# Patient Record
Sex: Female | Born: 1980 | Race: White | Hispanic: No | Marital: Married | Smoking: Never smoker
Health system: Southern US, Community
[De-identification: ages and names within clinical notes are randomized; demographics above are authoritative.]

## PROBLEM LIST (undated history)

## (undated) DIAGNOSIS — T7840XA Allergy, unspecified, initial encounter: Secondary | ICD-10-CM

## (undated) DIAGNOSIS — F419 Anxiety disorder, unspecified: Secondary | ICD-10-CM

## (undated) HISTORY — DX: Anxiety disorder, unspecified: F41.9

## (undated) HISTORY — DX: Allergy, unspecified, initial encounter: T78.40XA

---

## 2000-03-19 ENCOUNTER — Emergency Department (HOSPITAL_COMMUNITY): Admission: EM | Admit: 2000-03-19 | Discharge: 2000-03-20 | Payer: Self-pay | Admitting: Emergency Medicine

## 2001-03-01 ENCOUNTER — Other Ambulatory Visit: Admission: RE | Admit: 2001-03-01 | Discharge: 2001-03-01 | Payer: Self-pay | Admitting: Obstetrics and Gynecology

## 2001-03-01 ENCOUNTER — Encounter (INDEPENDENT_AMBULATORY_CARE_PROVIDER_SITE_OTHER): Payer: Self-pay | Admitting: Specialist

## 2001-03-14 ENCOUNTER — Other Ambulatory Visit: Admission: RE | Admit: 2001-03-14 | Discharge: 2001-03-14 | Payer: Self-pay | Admitting: Obstetrics and Gynecology

## 2001-03-14 ENCOUNTER — Encounter (INDEPENDENT_AMBULATORY_CARE_PROVIDER_SITE_OTHER): Payer: Self-pay

## 2001-06-17 ENCOUNTER — Other Ambulatory Visit: Admission: RE | Admit: 2001-06-17 | Discharge: 2001-06-17 | Payer: Self-pay | Admitting: *Deleted

## 2001-09-16 ENCOUNTER — Other Ambulatory Visit: Admission: RE | Admit: 2001-09-16 | Discharge: 2001-09-16 | Payer: Self-pay | Admitting: Obstetrics and Gynecology

## 2001-12-24 ENCOUNTER — Other Ambulatory Visit: Admission: RE | Admit: 2001-12-24 | Discharge: 2001-12-24 | Payer: Self-pay | Admitting: Obstetrics and Gynecology

## 2002-03-21 ENCOUNTER — Other Ambulatory Visit: Admission: RE | Admit: 2002-03-21 | Discharge: 2002-03-21 | Payer: Self-pay | Admitting: Obstetrics and Gynecology

## 2003-01-09 ENCOUNTER — Other Ambulatory Visit: Admission: RE | Admit: 2003-01-09 | Discharge: 2003-01-09 | Payer: Self-pay | Admitting: Obstetrics and Gynecology

## 2003-08-27 ENCOUNTER — Inpatient Hospital Stay (HOSPITAL_COMMUNITY): Admission: AD | Admit: 2003-08-27 | Discharge: 2003-08-27 | Payer: Self-pay | Admitting: Obstetrics and Gynecology

## 2003-08-28 ENCOUNTER — Ambulatory Visit (HOSPITAL_COMMUNITY): Admission: RE | Admit: 2003-08-28 | Discharge: 2003-08-28 | Payer: Self-pay | Admitting: Obstetrics and Gynecology

## 2003-10-29 ENCOUNTER — Inpatient Hospital Stay (HOSPITAL_COMMUNITY): Admission: AD | Admit: 2003-10-29 | Discharge: 2003-10-29 | Payer: Self-pay | Admitting: Obstetrics and Gynecology

## 2003-10-30 ENCOUNTER — Inpatient Hospital Stay (HOSPITAL_COMMUNITY): Admission: AD | Admit: 2003-10-30 | Discharge: 2003-11-02 | Payer: Self-pay | Admitting: Obstetrics and Gynecology

## 2004-02-18 ENCOUNTER — Other Ambulatory Visit: Admission: RE | Admit: 2004-02-18 | Discharge: 2004-02-18 | Payer: Self-pay | Admitting: Obstetrics and Gynecology

## 2004-03-07 ENCOUNTER — Encounter: Admission: RE | Admit: 2004-03-07 | Discharge: 2004-03-07 | Payer: Self-pay | Admitting: Family Medicine

## 2005-08-09 ENCOUNTER — Inpatient Hospital Stay (HOSPITAL_COMMUNITY): Admission: AD | Admit: 2005-08-09 | Discharge: 2005-08-09 | Payer: Self-pay | Admitting: Obstetrics and Gynecology

## 2005-08-10 ENCOUNTER — Inpatient Hospital Stay (HOSPITAL_COMMUNITY): Admission: AD | Admit: 2005-08-10 | Discharge: 2005-08-13 | Payer: Self-pay | Admitting: Obstetrics and Gynecology

## 2006-01-16 ENCOUNTER — Other Ambulatory Visit: Admission: RE | Admit: 2006-01-16 | Discharge: 2006-01-16 | Payer: Self-pay | Admitting: Obstetrics and Gynecology

## 2007-03-28 ENCOUNTER — Inpatient Hospital Stay (HOSPITAL_COMMUNITY): Admission: AD | Admit: 2007-03-28 | Discharge: 2007-03-28 | Payer: Self-pay | Admitting: Obstetrics and Gynecology

## 2007-06-01 ENCOUNTER — Inpatient Hospital Stay (HOSPITAL_COMMUNITY): Admission: AD | Admit: 2007-06-01 | Discharge: 2007-06-03 | Payer: Self-pay | Admitting: Obstetrics and Gynecology

## 2007-06-15 ENCOUNTER — Inpatient Hospital Stay (HOSPITAL_COMMUNITY): Admission: AD | Admit: 2007-06-15 | Discharge: 2007-06-15 | Payer: Self-pay | Admitting: Obstetrics and Gynecology

## 2008-09-26 ENCOUNTER — Inpatient Hospital Stay (HOSPITAL_COMMUNITY): Admission: AD | Admit: 2008-09-26 | Discharge: 2008-09-26 | Payer: Self-pay | Admitting: Obstetrics and Gynecology

## 2009-04-02 ENCOUNTER — Inpatient Hospital Stay (HOSPITAL_COMMUNITY): Admission: AD | Admit: 2009-04-02 | Discharge: 2009-04-05 | Payer: Self-pay | Admitting: Obstetrics and Gynecology

## 2011-01-01 LAB — CBC
HCT: 33.4 % — ABNORMAL LOW (ref 36.0–46.0)
HCT: 36.3 % (ref 36.0–46.0)
Hemoglobin: 11.8 g/dL — ABNORMAL LOW (ref 12.0–15.0)
Hemoglobin: 12.7 g/dL (ref 12.0–15.0)
MCHC: 35 g/dL (ref 30.0–36.0)
MCHC: 35.2 g/dL (ref 30.0–36.0)
MCV: 94.4 fL (ref 78.0–100.0)
MCV: 95 fL (ref 78.0–100.0)
Platelets: 116 10*3/uL — ABNORMAL LOW (ref 150–400)
Platelets: 135 10*3/uL — ABNORMAL LOW (ref 150–400)
RBC: 3.52 MIL/uL — ABNORMAL LOW (ref 3.87–5.11)
RBC: 3.84 MIL/uL — ABNORMAL LOW (ref 3.87–5.11)
RDW: 13.1 % (ref 11.5–15.5)
RDW: 13.1 % (ref 11.5–15.5)
WBC: 11 10*3/uL — ABNORMAL HIGH (ref 4.0–10.5)
WBC: 9.2 10*3/uL (ref 4.0–10.5)

## 2011-01-01 LAB — RPR: RPR Ser Ql: NONREACTIVE

## 2011-01-01 LAB — CCBB MATERNAL DONOR DRAW

## 2011-01-09 LAB — RAPID STREP SCREEN (MED CTR MEBANE ONLY): Streptococcus, Group A Screen (Direct): NEGATIVE

## 2011-02-07 NOTE — H&P (Signed)
NAME:  Jeanne Mercado, Jeanne Mercado NO.:  [id number]   MEDICAL RECORD NO.:  [id number]          PATIENT TYPE:  INP   LOCATION:  9111                          FACILITY:  [facility]   PHYSICIAN:  Osborn Coho, M.D.   DATE OF BIRTH:  12-31-1980   DATE OF ADMISSION:  06/01/2007  DATE OF DISCHARGE:                              HISTORY & PHYSICAL   Jeanne Mercado is a 30 year old married white female, gravida 3, para 2-0-  0-2 at 39-1/7 weeks, who presents with contractions this morning.  She  denies leaking or bleeding.  Reports positive fetal movement.  Her  pregnancy has been followed by the [facility] nurse midwife  service and has been remarkable for:  1. LEEP procedure in 2002.  2. History of mild asthma.  3. Group B strep negative.   Her prenatal labs were collected on November 08, 2006.  Hemoglobin was  13.5, hematocrit 41.1, platelets 194,000.  Blood type A+.  Antibody  negative.  RPR nonreactive.  Rubella immune.  Hepatitis B surface  antigen negative.  HIV nonreactive.  Pap smear within normal limits.  A  one-hour Glucola from March 06, 2007 was 62.  RPR at that time was  nonreactive.  Culture of the vaginal tract for Group B strep on May 06, 2007 was negative.   HISTORY OF PRESENT PREGNANCY:  Patient presented for care at [facility] on November 08, 2006 at [redacted] weeks gestation.  She declined first-  trimester screening.  Ultrasonography on this date was done due to  inability to hear fetal heart tones.  EDC was confirmed to be June 07, 2007, which was consistent with last menstrual period dating.  Anatomy ultrasound was done at 18-1/[redacted] weeks gestation.  There was low-  lying placenta at that time.  Patient declined quad screen.  Ultrasound  was performed at 26-1/[redacted] weeks gestation to evaluate placenta, which was  no longer low-lying.  Estimated fetal weight was at the 86 percentile  with normal fluid.  The rest of her prenatal care was  unremarkable.   OB HISTORY:  She is gravida 3, para 2-0-0-2.  In February, 2005, she had  a vaginal delivery of a female infant weighing 6 pounds, 5 ounces at [redacted]  weeks gestation after 6 hours of later.  His name is Fayrene Fearing.  The  pregnancy was uncomplicated.  In November, 2006, she had a vaginal  delivery of a female infant weighing 6 pounds, 12 ounces at [redacted] weeks  gestation after nine hours in labor.  She had an epidural for  anesthesia.  His name is Chrissie Noa.  That pregnancy and birth are  uncomplicated as well.  This current pregnancy is the third pregnancy.   PAST MEDICAL HISTORY:  No medication allergies.  She experienced  menarche at the age of 59 with 28-30 day cycles lasting three days.  She  has used oral contraceptives and condoms in the past for contraception.  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  She reports having had the usual  childhood illnesses.  She  was diagnosed in 2004 with asthma but does not require medications.  She  has reflux with a possible ulcer in the past.  She fractured her left  arm as a child.   Surgical history is remarkable for tubes being placed in her ear as a  child.  She also has sporadic hypoglycemia.   FAMILY HISTORY:  Paternal grandmother with chronic hypertension.  Paternal grandmother with varicosities.  Maternal aunt and mother with  hypothyroidism.  Father has prostate cancer.  Father has a history of  abuse by his parents.   GENETIC HISTORY:  Remarkable for mother with malformed ear and unable to  hear out of that ear.  Also, mother with heart murmur.   SOCIAL HISTORY:  Patient is married to the father of the baby.  His name  is Trey Paula.  He is involved and supportive.  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  Father of the baby has six years of college  education and is employed full time.  They deny any alcohol, tobacco, or  illicit drug use with the pregnancy.   OBJECTIVE DATA:  VITAL  SIGNS:  Stable.  She is afebrile.  HEENT:  Grossly within normal limits.  CHEST:  Clear to auscultation.  HEART:  Regular rate and rhythm.  ABDOMEN: Gravid in contour.  Fundal height extending approximately 37 cm  above the pubic symphysis.  Fetal heart rate is in the 140s and  reassuring with occasional variable.  The cervix is complete, 100%  effaced, vertex at 0 station with intact membranes.  EXTREMITIES:  Normal.   ASSESSMENT:  1. Intrauterine pregnancy at term.  2. Transition and end of first stage.   PLAN:  1. Prepare for birth.  2. Will stay in maternity admissions secondary to room shortage on      labor and delivery.      Cam Hai, C.N.M.      Osborn Coho, M.D.  Electronically Signed    KS/MEDQ  D:  06/01/2007  T:  06/01/2007  Job:  [id number]

## 2011-02-10 NOTE — H&P (Signed)
NAME:  Jeanne Mercado, Jeanne Mercado               ACCOUNT NO.:  [id number]   MEDICAL RECORD NO.:  [id number]          PATIENT TYPE:  INP   LOCATION:  9165                          [facility]ACILITY:  [facility]   PHYSICIAN:  Naima A. Dillard, M.D. DATE OF BIRTH:  [birth year]   DATE OF ADMISSION:  08/10/2005  DATE OF DISCHARGE:                                HISTORY & PHYSICAL   HISTORY OF PRESENT ILLNESS:  The patient is a 30 year old, gravida 2, para 1-  0-0-1, admitted at [redacted] weeks gestation with complaints of regular uterine  contractions since 6 p.m. on the day of admission.  The patient has been  having prodromal labor since August 09, 2005 but the patient reports that  the uterine contractions increased in frequency and intensity as noted  above.  The patient denies any leakage of fluid or bleeding.  The patient  reports that her fetus is moving normally.  The patient reports that she has  had some bloody mucus at times.  The patient's group B strep test is  negative.  The patient is seen in MAU on August 09, 2005 at which time her  cervix was 3-4 cm and did not change despite ambulation.  The patient's  pregnancy is remarkable:  1.  History of LEEP procedure.  2.  Mild asthma.  3.  Transferred in at 28 weeks.   PRENATAL LABS:  Hemoglobin 12.4, hematocrit 36.2, blood type A positive,  antibody screen negative.  Sickle cell trait negative. I do not know why  that was done.  RPR nonreactive.  Rubella titer immune.  Hepatitis C  nonreactive.  HIV was declined.  Pap smear within normal limits.  Gonorrhea  and Chlamydia both negative.  Quad screen negative.  One-hour Glucola 114.  Twenty-eight week hemoglobin 12.1, group B strep negative.  Repeat GC and  Chlamydia cultures in the third trimester were declined.   HISTORY OF PRESENT PREGNANCY:  The patient entered care at [facility] after transfer at [redacted] weeks gestation.  The patient had been receiving  routine prenatal care prior to 28 weeks since  approximately 10 weeks.  The  patient recently moved back to the area and, therefore, desired to  reestablish care at [facility].  The patient's last menstrual  period November 16, 2004 which gives an estimated date of confinement of  August 24, 2005.  That estimated date of confinement was confirmed by  ultrasound.  The patient's prenatal course has been very unremarkable.  The  patient's ultrasound previously done prior to transfer and was reviewed and  was within normal limits. Otherwise the patient's pregnancy unremarkable.   OB HISTORY:  1.  February 2005, spontaneous vaginal delivery.  Female infant, 6 pounds 5      ounces at [redacted] weeks gestation, six-hour labor without complications.  2.  Present.   GYN HISTORY:  The patient reports menarche at age 18 with 28-30 day cycles,  lasting three days.  The patient denies any history of STDs.  The patient  has a history of abnormal Pap smears and treated with LEEP.  However, the  patient's repeat Pap smears have been within normal limits since her LEEP  procedure.   PAST MEDICAL HISTORY:  1.  Mild asthma.  2.  Occasional GI reflux.  3.  Non-recurrent UTIs.  4.  History of pyelonephritis x1.  5.  The patient also with history of hypoglycemia at times.   PAST SURGICAL HISTORY:  Bilateral myringotomy as an infant. Otherwise  negative.   ALLERGIES:  SEAFOOD and IODINE.   CURRENT MEDICATIONS:  Prenatal vitamins.   FAMILY HISTORY:  Maternal grandmother with hypertension and varicose veins.  Father prostate cancer.  Otherwise negative.   GENETIC HISTORY:  Mother with malformed ear with hearing loss as well as a  heart murmur.  Otherwise negative genetic however.   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  The patient is  married to [place] who is the father of the baby who is involved and  supportive.  The patient denies use of tobacco, alcohol or street drugs.  The patient's domestic violence screen is  negative.   REVIEW OF SYSTEMS:  Typical of the last trimester of pregnancy.   PHYSICAL EXAMINATION:  VITAL SIGNS:  The patient is afebrile.  Vital signs  are stable.  HEENT:  Within normal limits.  LUNGS:  Clear.  HEART:  Regular rate and rhythm without murmur, rub, or gallop.  BREASTS:  Soft.  ABDOMEN:  Soft, nontender and gravid with fundal height consistent with [redacted]  weeks gestation.  Fetus is longitudinal lie and vertex by Leopold's  maneuvers. Fetal heart rate 140's with accelerations noted to be present and  short-term variabilities present.  Fetal heart rate is overall reassuring.  Uterine contractions are noted to be every two to four minutes and moderate  to strong to palpation.  PELVIC:  On sterile vaginal exam, cervix 7 cm dilated, completely effaced, -  2 station.  Vertex presentation and bulging bag of waters.  EXTREMITIES:  No edema.  Homan's sign negative bilaterally.   ASSESSMENT:  1.  Intrauterine pregnancy at 38 weeks.  2.  Labor.   PLAN:  1.  The patient will be admitted to birthing suite for consult with Dr.      Jaymes Graff.  2.  Routine CNM orders.  3.  The patient plans an unmedicated birth if at all possible.      Rhona Leavens, CNM      Naima A. Normand Sloop, M.D.  Electronically Signed    NOS/MEDQ  D:  08/11/2005  T:  08/11/2005  Job:  [id number]

## 2011-02-10 NOTE — H&P (Signed)
NAME:  ALEXSYS, ESKIN NO.:  [id number]   MEDICAL RECORD NO.:  [id number]                   PATIENT TYPE:  INP   LOCATION:  9165                                 FACILITY:  [facility]   PHYSICIAN:  Osborn Coho, M.D.                DATE OF BIRTH:  1981-01-27   DATE OF ADMISSION:  10/30/2003  DATE OF DISCHARGE:                                HISTORY & PHYSICAL   Ms. Mcloughlin is a 30 year old married white female, gravida 1, para 0 at 74  1/[redacted] weeks gestation who presents complaining of uterine contractions every 4-  5 minutes all day long and now with increased intensity.  She denies any  leaking but has seen some bloody show. She reports positive fetal movement.  Her pregnancy has been followed at [facility] by the Certified  Nurse Midwife service and has been essentially uncomplicated though at risk  for 1) history of a LEEP procedure in 2002, 2) first trimester bleeding and  3) history of asthma.  Her group B strep is negative.  She desires to have  as noninterventive a labor as possible.   OB/GYN HISTORY:  She is a primigravida with LMP of Feb 11, 2003 giving an  [facility] of November 18, 2003 confirmed by early ultrasound.  Her other GYN  history:  She has used oral contraceptives in the past.  She had an abnormal  Pap in 2002 that was treated with colposcopy and LEEP procedure and her  PAP's have been normal subsequently.   GENERAL MEDICAL HISTORY:  She is allergic to SEAFOOD and seasonal allergies.  She reports having had the usual childhood diseases.  She reports a history  of asthma but no problems with this pregnancy. She reports a history of  heartburn in the past, occasional urinary tract infections and fractured  left arm in fifth grade and her only surgery was having tubes placed in her  ears when she was an infant.   FAMILY HISTORY:  Significant for paternal grandmother with hypertension,  paternal aunt with aneurysm, paternal  grandmother with varicose veins, great  maternal grandmother with diabetes.  Aunt with hypothyroidism, maternal  grandfather with Parkinson's disease.   GENETIC HISTORY:  Negative.   SOCIAL HISTORY:  She is married to Festus Holts who is involved and  supportive. She is employed as a Runner, broadcasting/film/video, he is employed as a Research scientist (medical).  They are of the [employer]. They deny any illicit drug use, alcohol or  smoking with this pregnancy.   PRENATAL LABS:  Her blood type is A positive, antibody screen is negative,  syphilis is nonreactive, rubella is immune, hepatitis B surface antigen is  negative, HIV is nonreactive.  GC and chlamydia are both negative.  Pap is  within normal limits and her 36 week beta strep was negative.   PHYSICAL EXAMINATION:  VITAL SIGNS:  Stable, she is afebrile.  HEENT:  Grossly within normal limits.  HEART:  Regular rhythm and rate.  CHEST:  Clear.  BREASTS:  Soft and nontender.  ABDOMEN:  Gravid with uterine contractions every four minutes. Fetal heart  rate is reactive and reassuring.  Her cervix is 5 1/2 cm, 100% vertex, -1  with bulging membranes.  EXTREMITIES:  Within normal limits.   ASSESSMENT:  1. Intrauterine pregnancy at term.  2. Active labor.  3. Negative group B strep.   PLAN:  Admit to labor and delivery, to follow routine midwife orders and Dr.  Su Hilt has been notified of the patient's admission.     Concha Pyo. Duplantis, C.N.M.              Osborn Coho, M.D.    SJD/MEDQ  D:  10/30/2003  T:  10/30/2003  Job:  [id number]

## 2011-07-06 LAB — URINE MICROSCOPIC-ADD ON

## 2011-07-06 LAB — DIFFERENTIAL
Basophils Absolute: 0
Basophils Relative: 0
Eosinophils Absolute: 0.2
Eosinophils Relative: 2
Lymphocytes Relative: 12
Lymphs Abs: 1.1
Monocytes Absolute: 0.4
Monocytes Relative: 4
Neutro Abs: 7.6
Neutrophils Relative %: 82 — ABNORMAL HIGH

## 2011-07-06 LAB — CBC
HCT: 38.4
Hemoglobin: 13.4
MCHC: 34.8
MCV: 91.5
Platelets: 191
RBC: 4.2
RDW: 12.2
WBC: 9.3

## 2011-07-06 LAB — URINALYSIS, ROUTINE W REFLEX MICROSCOPIC
Bilirubin Urine: NEGATIVE
Glucose, UA: NEGATIVE
Ketones, ur: NEGATIVE
Leukocytes, UA: NEGATIVE
Nitrite: NEGATIVE
Protein, ur: NEGATIVE
Specific Gravity, Urine: <1.005 — ABNORMAL LOW
Urobilinogen, UA: 0.2
pH: 5.5

## 2011-07-07 LAB — CBC
HCT: 30.3 — ABNORMAL LOW
HCT: 35 — ABNORMAL LOW
Hemoglobin: 10.7 — ABNORMAL LOW
Hemoglobin: 12.3
MCHC: 35.2
MCHC: 35.3
MCV: 92.6
MCV: 93.6
Platelets: 132 — ABNORMAL LOW
Platelets: 160
RBC: 3.24 — ABNORMAL LOW
RBC: 3.78 — ABNORMAL LOW
RDW: 12.5
RDW: 13.2
WBC: 13 — ABNORMAL HIGH
WBC: 18 — ABNORMAL HIGH

## 2011-07-07 LAB — RPR: RPR Ser Ql: NONREACTIVE

## 2011-07-11 LAB — URINALYSIS, ROUTINE W REFLEX MICROSCOPIC
Bilirubin Urine: NEGATIVE
Glucose, UA: NEGATIVE
Hgb urine dipstick: NEGATIVE
Ketones, ur: NEGATIVE
Nitrite: NEGATIVE
Protein, ur: NEGATIVE
Specific Gravity, Urine: 1.01
Urobilinogen, UA: 0.2
pH: 6

## 2011-07-11 LAB — WET PREP, GENITAL
Clue Cells Wet Prep HPF POC: NONE SEEN
Trich, Wet Prep: NONE SEEN
Yeast Wet Prep HPF POC: NONE SEEN

## 2011-07-11 LAB — FETAL FIBRONECTIN: Fetal Fibronectin: NEGATIVE

## 2013-01-29 ENCOUNTER — Other Ambulatory Visit: Payer: Self-pay | Admitting: Certified Nurse Midwife

## 2013-02-01 ENCOUNTER — Other Ambulatory Visit: Payer: Self-pay

## 2013-02-05 ENCOUNTER — Ambulatory Visit
Admission: RE | Admit: 2013-02-05 | Discharge: 2013-02-05 | Disposition: A | Discharge status: Home / Self Care | Payer: BC Managed Care – PPO | Source: Ambulatory Visit | Attending: Certified Nurse Midwife | Admitting: Certified Nurse Midwife

## 2013-02-05 MED ORDER — GADOBENATE DIMEGLUMINE 529 MG/ML IV SOLN
5.0000 mL | Freq: Once | INTRAVENOUS | Status: AC | PRN
  Administered 2013-02-05: 5 mL via INTRAVENOUS

## 2014-08-10 ENCOUNTER — Ambulatory Visit (INDEPENDENT_AMBULATORY_CARE_PROVIDER_SITE_OTHER): Payer: BC Managed Care – PPO | Admitting: Internal Medicine

## 2014-08-10 VITALS — Ht 64.75 in | Wt 124.0 lb

## 2014-08-10 DIAGNOSIS — M79643 Pain in unspecified hand: Secondary | ICD-10-CM

## 2014-08-10 NOTE — Progress Notes (Signed)
   Subjective:  This chart was scribed for Jeanne Siaobert Kaylee Trivett, MD by Haywood PaoNadim Abu Hashem, ED Scribe at [facility].The patient was seen in exam room 06 and the patient's care was started at 6:01 PM.   Patient ID: Jeanne Mercado, female    DOB: 1981-05-29, 33 y.o.   MRN: [id number] Chief Complaint  Patient presents with  . Laceration    finger    HPI HPI Comments: Jeanne Mercado is a 33 y.o. female [facility] of a laceration on her left ring finger and middle finger. Pt states she was blending spaghetti sauce around 4:00 PM and her fingers got caught in the blender. Pt denies loss of sensation in her fingers.  Review of Systems  Skin: Positive for wound.      Objective:   Physical Exam  Constitutional: She is oriented to person, place, and time. She appears well-developed and well-nourished.  HENT:  Head: Normocephalic and atraumatic.  Eyes: EOM are normal.  Neck: Normal range of motion.  Cardiovascular: Normal rate.   Pulmonary/Chest: Effort normal.  Musculoskeletal:  Laceration on the dorsal ventral on the 3rd left finger Laceration the on ventral left ring finger.  Neurological: She is alert and oriented to person, place, and time.  Skin: Skin is warm and dry.  Psychiatric: She has a normal mood and affect. Her behavior is normal.  Nursing note and vitals reviewed.  Ht 5' 4.75" (1.645 m)  Wt 124 lb (56.246 kg)  BMI 20.79 kg/m2  LMP 07/27/2014      Assessment & Plan:  I personally performed the services described in this documentation, which was scribed in my presence. The recorded information has been reviewed and is accurate.  Superficial lacerations only Keep clean

## 2017-04-10 ENCOUNTER — Other Ambulatory Visit: Payer: Self-pay | Admitting: Certified Nurse Midwife

## 2017-04-10 DIAGNOSIS — R221 Localized swelling, mass and lump, neck: Secondary | ICD-10-CM

## 2017-04-17 ENCOUNTER — Ambulatory Visit
Admission: RE | Admit: 2017-04-17 | Discharge: 2017-04-17 | Disposition: A | Discharge status: Home / Self Care | Payer: BLUE CROSS/BLUE SHIELD | Source: Ambulatory Visit | Attending: Certified Nurse Midwife | Admitting: Certified Nurse Midwife

## 2017-04-17 DIAGNOSIS — R221 Localized swelling, mass and lump, neck: Secondary | ICD-10-CM

## 2017-09-15 IMAGING — US US THYROID
1 series · 14 of 25 positions shown · non-contrast
Comparison: None.

CLINICAL DATA: Palpable abnormality. Enlarged thyroid on physical
examination.

EXAM:
THYROID ULTRASOUND
TECHNIQUE: Ultrasound examination of the thyroid gland and adjacent soft
tissues was performed.

[Series 1: us thyroid · 0.05mm/px · 14 of 36 slices shown]
[im 1/36]
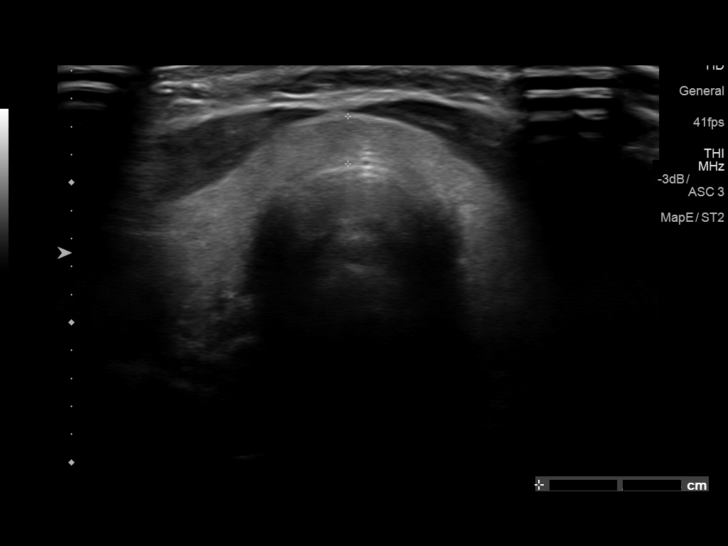
[im 3/36]
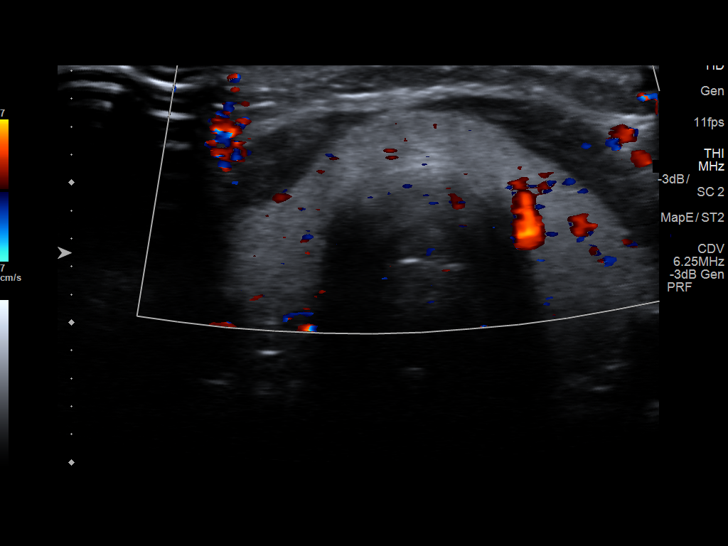
[im 6/36]
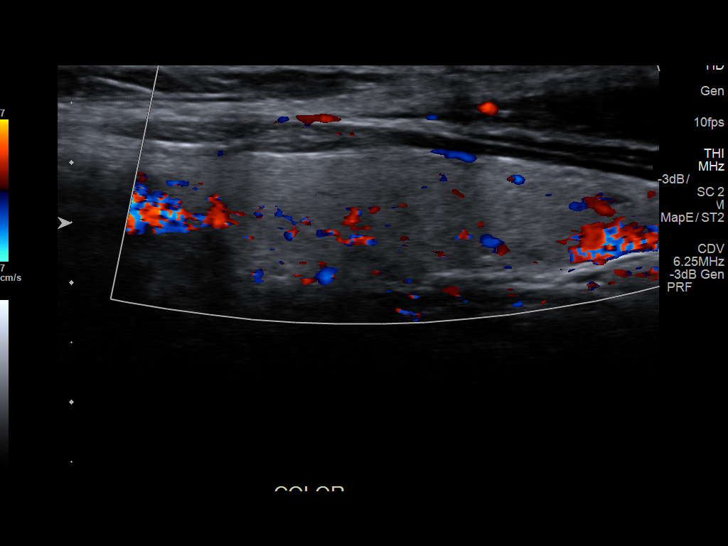
[im 9/36]
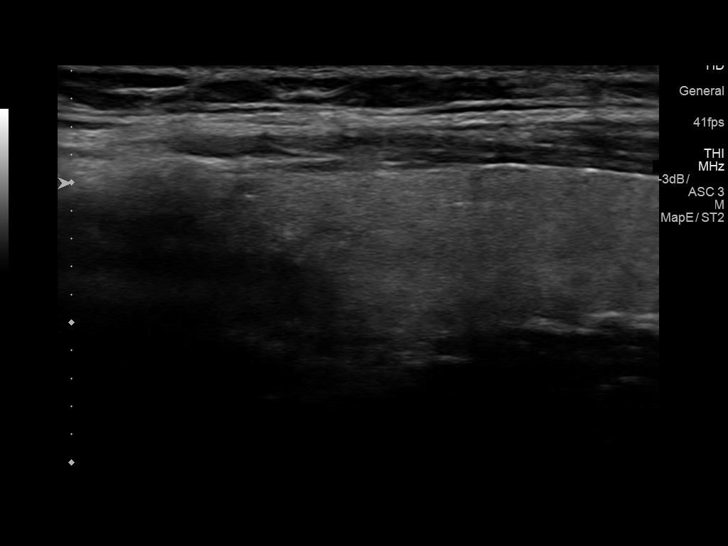
[im 12/36]
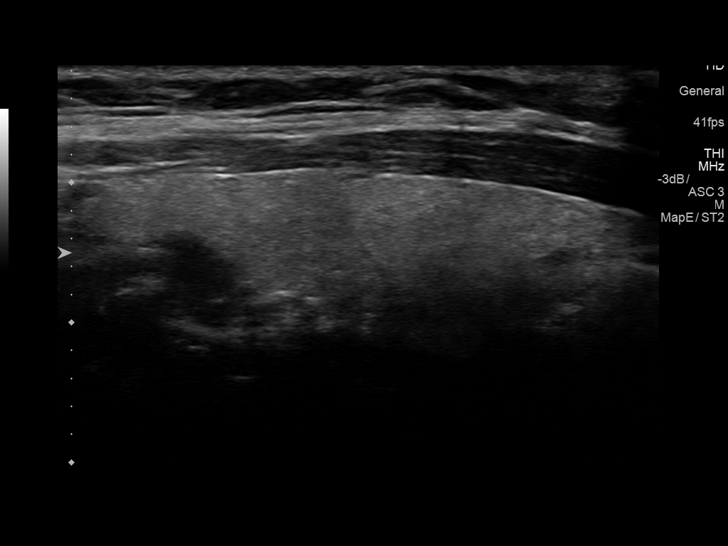
[im 14/36]
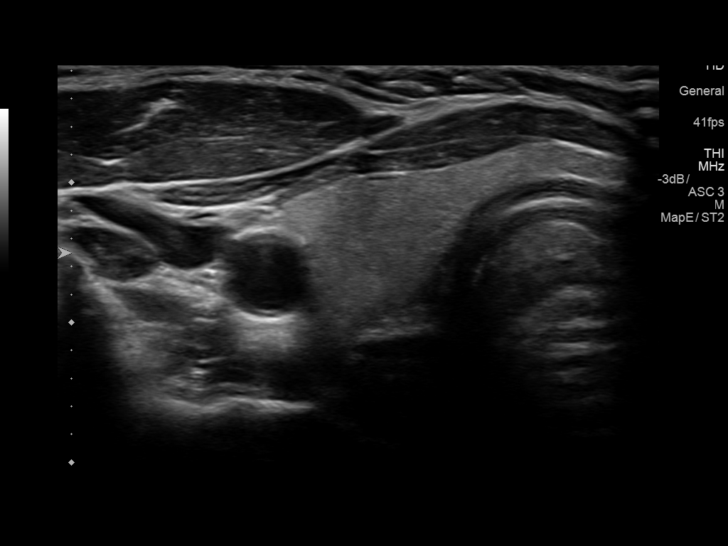
[im 17/36]
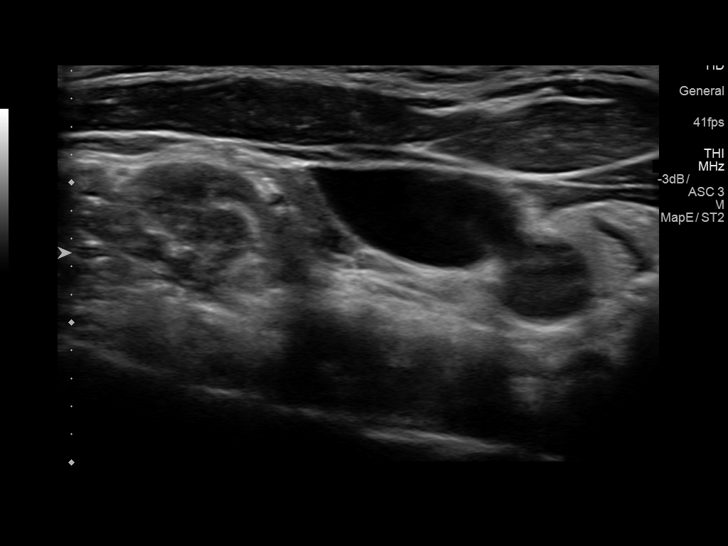
[im 19/36]
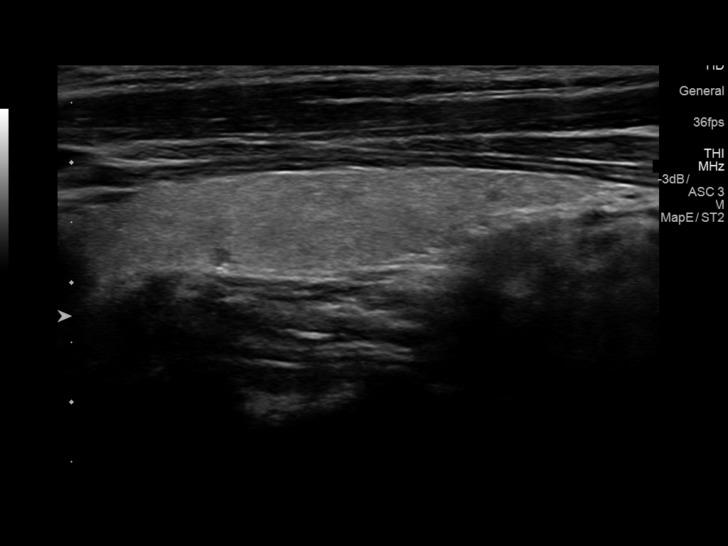
[im 22/36]
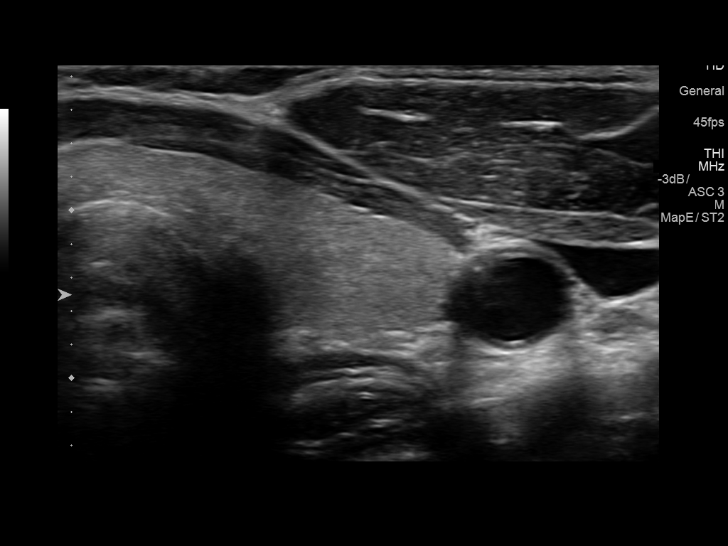
[im 24/36]
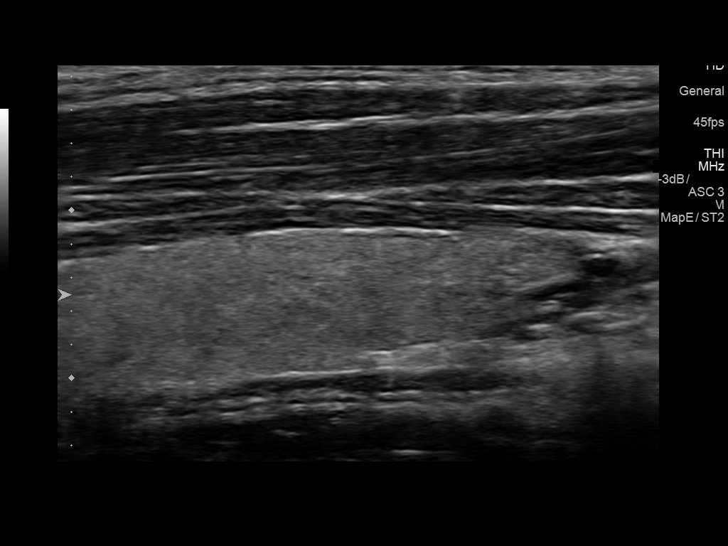
[im 27/36]
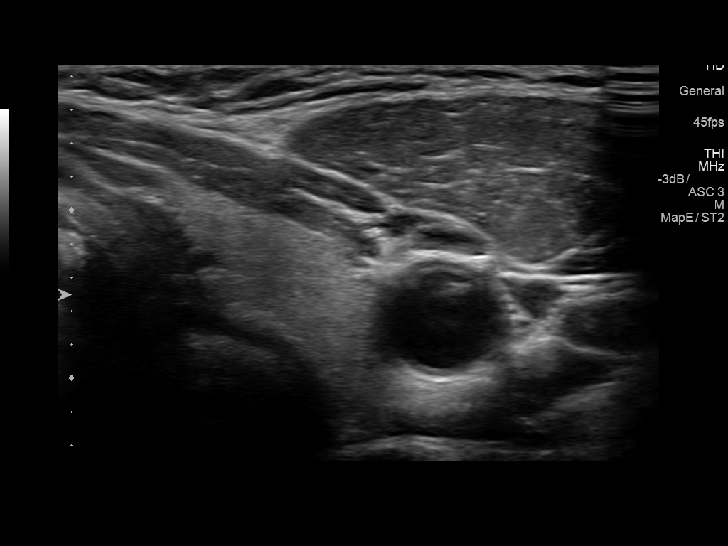
[im 30/36]
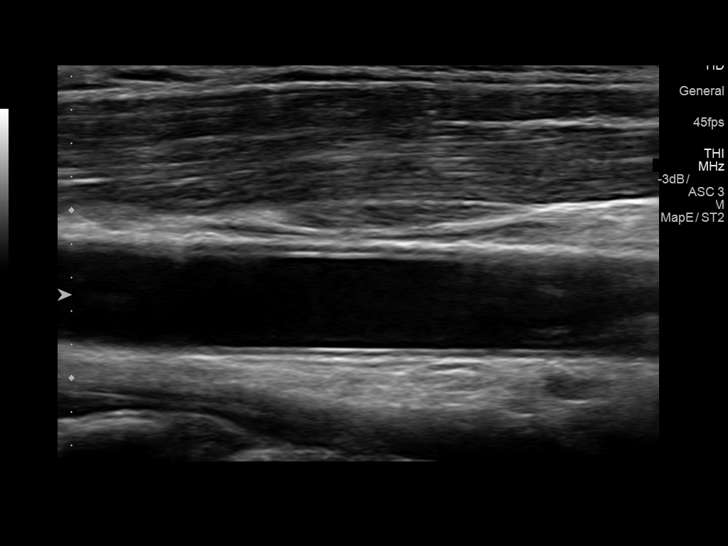
[im 33/36]
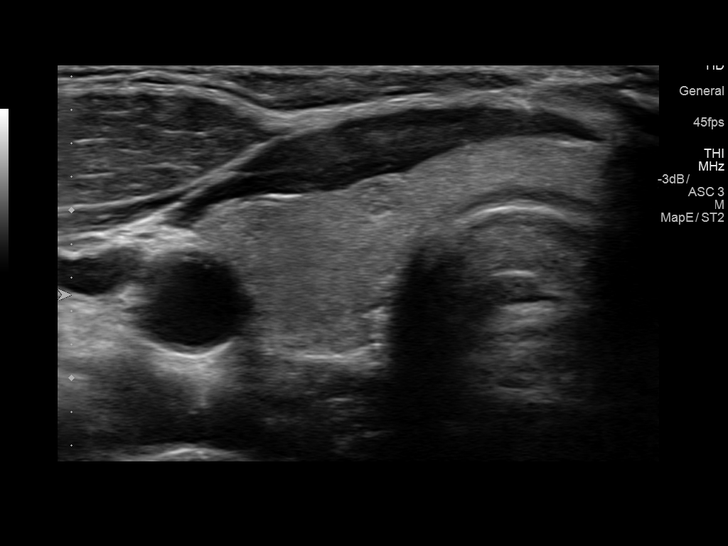
[im 36/36]
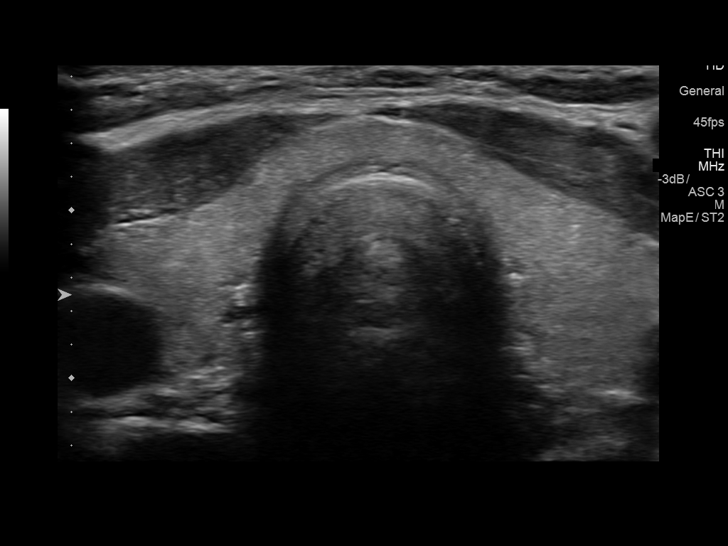

[14 of 25 positions shown; findings below may reference images not displayed]

FINDINGS: Parenchymal Echotexture: Normal

Isthmus: Normal in size measuring 0.3 cm in diameter

Right lobe: Normal in size measuring 5.1 x 1.1 x 1.2 cm

Left lobe: Normal in size measuring 4.5 x 0.9 x 1.2 cm

_________________________________________________________

Estimated total number of nodules >/= 1 cm: 0

Number of spongiform nodules >/=  2 cm not described below (TR1): 0

Number of mixed cystic and solid nodules >/= 1.5 cm not described
below (TR2): 0

_________________________________________________________

No discrete nodules are seen within the thyroid gland.
IMPRESSION: Normal thyroid ultrasound. Specifically, no evidence of thyromegaly
or discrete thyroid nodule or mass.

The above is in keeping with the ACR TI-RADS recommendations - [HOSPITAL] 8077;[DATE].

## 2019-08-15 ENCOUNTER — Other Ambulatory Visit: Payer: Self-pay

## 2019-08-15 DIAGNOSIS — Z20822 Contact with and (suspected) exposure to covid-19: Secondary | ICD-10-CM

## 2019-08-18 LAB — NOVEL CORONAVIRUS, NAA: SARS-CoV-2, NAA: NOT DETECTED
